# Patient Record
Sex: Male | Born: 2000 | Race: Black or African American | Hispanic: No | Marital: Single | Smoking: Never smoker
Health system: Southern US, Community
[De-identification: ages and names within clinical notes are randomized; demographics above are authoritative.]

## PROBLEM LIST (undated history)

## (undated) DIAGNOSIS — E119 Type 2 diabetes mellitus without complications: Secondary | ICD-10-CM

## (undated) DIAGNOSIS — Q249 Congenital malformation of heart, unspecified: Secondary | ICD-10-CM

## (undated) DIAGNOSIS — I10 Essential (primary) hypertension: Secondary | ICD-10-CM

## (undated) DIAGNOSIS — Q909 Down syndrome, unspecified: Secondary | ICD-10-CM

## (undated) HISTORY — PX: CARDIAC SURGERY: SHX584

## (undated) HISTORY — PX: CATARACT EXTRACTION, BILATERAL: SHX1313

## (undated) HISTORY — PX: TONSILLECTOMY: SUR1361

---
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
## 2019-01-22 DIAGNOSIS — Z794 Long term (current) use of insulin: Secondary | ICD-10-CM | POA: Insufficient documentation

## 2019-11-28 ENCOUNTER — Emergency Department (HOSPITAL_COMMUNITY)
Admission: EM | Admit: 2019-11-28 | Discharge: 2019-11-28 | Disposition: A | Discharge status: Home / Self Care | Payer: Medicaid Other | Attending: Emergency Medicine | Admitting: Emergency Medicine

## 2019-11-28 ENCOUNTER — Encounter (HOSPITAL_COMMUNITY): Payer: Self-pay

## 2019-11-28 ENCOUNTER — Emergency Department (HOSPITAL_COMMUNITY): Payer: Medicaid Other

## 2019-11-28 ENCOUNTER — Other Ambulatory Visit: Payer: Self-pay

## 2019-11-28 DIAGNOSIS — Z79899 Other long term (current) drug therapy: Secondary | ICD-10-CM | POA: Insufficient documentation

## 2019-11-28 DIAGNOSIS — Q909 Down syndrome, unspecified: Secondary | ICD-10-CM | POA: Diagnosis not present

## 2019-11-28 DIAGNOSIS — Z7982 Long term (current) use of aspirin: Secondary | ICD-10-CM | POA: Insufficient documentation

## 2019-11-28 DIAGNOSIS — Z794 Long term (current) use of insulin: Secondary | ICD-10-CM | POA: Diagnosis not present

## 2019-11-28 DIAGNOSIS — E119 Type 2 diabetes mellitus without complications: Secondary | ICD-10-CM | POA: Insufficient documentation

## 2019-11-28 DIAGNOSIS — R55 Syncope and collapse: Secondary | ICD-10-CM | POA: Diagnosis present

## 2019-11-28 DIAGNOSIS — Z20822 Contact with and (suspected) exposure to covid-19: Secondary | ICD-10-CM | POA: Insufficient documentation

## 2019-11-28 DIAGNOSIS — I10 Essential (primary) hypertension: Secondary | ICD-10-CM | POA: Diagnosis not present

## 2019-11-28 HISTORY — DX: Down syndrome, unspecified: Q90.9

## 2019-11-28 HISTORY — DX: Type 2 diabetes mellitus without complications: E11.9

## 2019-11-28 HISTORY — DX: Essential (primary) hypertension: I10

## 2019-11-28 HISTORY — DX: Congenital malformation of heart, unspecified: Q24.9

## 2019-11-28 LAB — CBC WITH DIFFERENTIAL/PLATELET
Abs Immature Granulocytes: 0.02 10*3/uL (ref 0.00–0.07)
Basophils Absolute: 0.1 10*3/uL (ref 0.0–0.1)
Basophils Relative: 1 %
Eosinophils Absolute: 0.1 10*3/uL (ref 0.0–0.5)
Eosinophils Relative: 1 %
HCT: 49.7 % (ref 39.0–52.0)
Hemoglobin: 17.2 g/dL — ABNORMAL HIGH (ref 13.0–17.0)
Immature Granulocytes: 0 %
Lymphocytes Relative: 32 %
Lymphs Abs: 2.4 10*3/uL (ref 0.7–4.0)
MCH: 29.8 pg (ref 26.0–34.0)
MCHC: 34.6 g/dL (ref 30.0–36.0)
MCV: 86 fL (ref 80.0–100.0)
Monocytes Absolute: 0.7 10*3/uL (ref 0.1–1.0)
Monocytes Relative: 9 %
Neutro Abs: 4.2 10*3/uL (ref 1.7–7.7)
Neutrophils Relative %: 57 %
Platelets: 225 10*3/uL (ref 150–400)
RBC: 5.78 MIL/uL (ref 4.22–5.81)
RDW: 13.6 % (ref 11.5–15.5)
WBC: 7.4 10*3/uL (ref 4.0–10.5)
nRBC: 0 % (ref 0.0–0.2)

## 2019-11-28 LAB — COMPREHENSIVE METABOLIC PANEL
ALT: 24 U/L (ref 0–44)
AST: 24 U/L (ref 15–41)
Albumin: 4.3 g/dL (ref 3.5–5.0)
Alkaline Phosphatase: 80 U/L (ref 38–126)
Anion gap: 5 (ref 5–15)
BUN: 14 mg/dL (ref 6–20)
CO2: 30 mmol/L (ref 22–32)
Calcium: 9.1 mg/dL (ref 8.9–10.3)
Chloride: 101 mmol/L (ref 98–111)
Creatinine, Ser: 1.12 mg/dL (ref 0.61–1.24)
GFR calc Af Amer: >60 mL/min (ref >60)
GFR calc non Af Amer: >60 mL/min (ref >60)
Glucose, Bld: 135 mg/dL — ABNORMAL HIGH (ref 70–99)
Potassium: 4.4 mmol/L (ref 3.5–5.1)
Sodium: 136 mmol/L (ref 135–145)
Total Bilirubin: 1 mg/dL (ref 0.3–1.2)
Total Protein: 7.5 g/dL (ref 6.5–8.1)

## 2019-11-28 LAB — URINALYSIS, ROUTINE W REFLEX MICROSCOPIC
Bilirubin Urine: NEGATIVE
Glucose, UA: NEGATIVE mg/dL
Hgb urine dipstick: NEGATIVE
Ketones, ur: NEGATIVE mg/dL
Leukocytes,Ua: NEGATIVE
Nitrite: NEGATIVE
Protein, ur: NEGATIVE mg/dL
Specific Gravity, Urine: 1.006 (ref 1.005–1.030)
pH: 7 (ref 5.0–8.0)

## 2019-11-28 LAB — TROPONIN I (HIGH SENSITIVITY)
Troponin I (High Sensitivity): 4 ng/L (ref <18)
Troponin I (High Sensitivity): 5 ng/L (ref <18)

## 2019-11-28 LAB — RESPIRATORY PANEL BY RT PCR (FLU A&B, COVID)
Influenza A by PCR: NEGATIVE
Influenza B by PCR: NEGATIVE
SARS Coronavirus 2 by RT PCR: NEGATIVE

## 2019-11-28 LAB — LIPASE, BLOOD: Lipase: 23 U/L (ref 11–51)

## 2019-11-28 LAB — CBG MONITORING, ED: Glucose-Capillary: 153 mg/dL — ABNORMAL HIGH (ref 70–99)

## 2019-11-28 NOTE — ED Notes (Signed)
Pt provided with coke and a cheese stick.

## 2019-11-28 NOTE — ED Triage Notes (Signed)
Pt BIB [facility] from home. Pts grandmother was assisting pt with washing hands after using the restroom, pt began leaning against the sink and was able to lower him to the floor. Per family pt never experienced LOC or hit head. Per family pt is now at his baseline. Pt has history of Down Syndrome, HTN, DM and congential heart defects.

## 2019-11-28 NOTE — ED Provider Notes (Signed)
[facility]-EMERGENCY DEPT Provider Note   CSN: [id number] Arrival date & time: 11/28/19  1522     History Chief Complaint  Patient presents with   Near Syncope    Ralf Konopka is a 19 y.o. 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  Patient presents today with his mother following a near syncopal episode while washing his hands today around 130-2 PM.  Per patient's mother who was in her room at the time, the patient was washing his hands with his grandmother when he slumped over the sink.  He was gently lowered to the floor by the grandmother and apparently did not completely lose consciousness.  No injuries from the event.  They report that patient had complained about chest pain shortly after the incident and had also complained of chest pain the day before as well.  He is unable to describe the nature of his chest pain.  Patient reports that he "passed out" while washing his hands.  When asked how he is feeling patient replies with "good".  Mother reports that patient's previous heart surgeries were performed at [facility] but they are currently in the process of establishing cardiology provider here in [place].  No history of recent illness, vomiting, diarrhea, cough, abnormal behavior or any additional concerns.  Level 5 caveat Down syndrome HPI     Past Medical History:  Diagnosis Date   Congenital heart defect    Diabetes mellitus without complication (HCC)    Down syndrome    Hypertension     There are no problems to display for this patient.   Past Surgical History:  Procedure Laterality Date   CARDIAC SURGERY     CATARACT EXTRACTION, BILATERAL     TONSILLECTOMY         History reviewed. No pertinent family history.  Social History   Tobacco Use   Smoking status: Never Smoker   Smokeless tobacco: Never Used   Substance Use Topics   Alcohol use: Never   Drug use: Never    Home Medications Prior to Admission medications   Medication Sig Start Date End Date Taking? Authorizing Provider  aspirin 81 MG EC tablet Take 1 tablet by mouth daily.   Yes [provider]  atorvastatin (LIPITOR) 40 MG tablet Take 40 mg by mouth daily. 10/12/19  Yes [provider]  cetirizine HCl (ZYRTEC) 5 MG/5ML SOLN Take 5 mLs by mouth daily.   Yes [provider]  Cholecalciferol 50 MCG (2000 UT) CAPS Take 2,000 Units by mouth daily. 04/17/19  Yes [provider]  fluticasone (FLONASE) 50 MCG/ACT nasal spray Place 1 spray into the nose at bedtime as needed for allergies. 07/02/19 07/01/20 Yes [provider]  insulin glargine (LANTUS) 100 UNIT/ML injection Inject 18 Units into the skin at bedtime.   Yes [provider]  lisinopril (ZESTRIL) 5 MG tablet Take 5 mg by mouth daily. 10/14/19  Yes [provider]  montelukast (SINGULAIR) 10 MG tablet Take 10 mg by mouth daily. 09/06/19  Yes [provider]  RESTASIS MULTIDOSE 0.05 % ophthalmic emulsion 1 drop 2 (two) times daily. 07/18/19   [provider]    Allergies    Other  Review of Systems   Review of Systems  Unable to perform ROS: Other     Physical Exam Updated Vital Signs BP 115/62    Pulse 65    Temp 97.8  F (36.6 C)    Resp 20    Ht 4' (1.219 m)    Wt 63 kg    SpO2 100%    BMI 42.42 kg/m   Physical Exam Constitutional:      General: He is not in acute distress.    Appearance: Normal appearance. He is well-developed. He is obese. He is not ill-appearing or diaphoretic.  HENT:     Head: Normocephalic and atraumatic.     Right Ear: External ear normal.     Left Ear: External ear normal.     Nose: Nose normal.  Eyes:     General: Vision grossly intact. Gaze aligned appropriately.     Pupils: Pupils are equal, round, and reactive to light.  Neck:     Trachea: Trachea and  phonation normal. No tracheal deviation.  Pulmonary:     Effort: Pulmonary effort is normal. No respiratory distress.  Abdominal:     General: There is no distension.     Palpations: Abdomen is soft.     Tenderness: There is no abdominal tenderness. There is no guarding or rebound.  Musculoskeletal:        General: Normal range of motion.     Cervical back: Normal range of motion.  Skin:    General: Skin is warm and dry.  Neurological:     Mental Status: He is alert.     GCS: GCS eye subscore is 4. GCS verbal subscore is 5. GCS motor subscore is 6.     Comments: Speech is clear and goal oriented, follows commands Major Cranial nerves without deficit, no facial droop Normal strength in upper and lower extremities bilaterally including dorsiflexion and plantar flexion, strong and equal grip strength Sensation normal to light and sharp touch Moves extremities without ataxia, coordination intact  Psychiatric:        Behavior: Behavior normal.    ED Results / Procedures / Treatments   Labs (all labs ordered are listed, but only abnormal results are displayed) Labs Reviewed  CBC WITH DIFFERENTIAL/PLATELET - Abnormal; Notable for the following components:      Result Value   Hemoglobin 17.2 (*)    All other components within normal limits  COMPREHENSIVE METABOLIC PANEL - Abnormal; Notable for the following components:   Glucose, Bld 135 (*)    All other components within normal limits  URINALYSIS, ROUTINE W REFLEX MICROSCOPIC - Abnormal; Notable for the following components:   Color, Urine STRAW (*)    All other components within normal limits  CBG MONITORING, ED - Abnormal; Notable for the following components:   Glucose-Capillary 153 (*)    All other components within normal limits  RESPIRATORY PANEL BY RT PCR (FLU A&B, COVID)  LIPASE, BLOOD  TROPONIN I (HIGH SENSITIVITY)  TROPONIN I (HIGH SENSITIVITY)    EKG EKG Interpretation  Date/Time:  Friday November 28 2019  15:32:54 EST Ventricular Rate:  71 PR Interval:    QRS Duration: 97 QT Interval:  382 QTC Calculation: 416 R Axis:   94 Text Interpretation: Sinus rhythm Borderline right axis deviation ST elev, probable normal early repol pattern agree, no old comparison Confirmed by Charlesetta Shanks [phone]) on 11/28/2019 4:52:05 PM   Radiology DG Chest 2 View  Result Date: 11/28/2019 CLINICAL DATA:  Chest pain, near syncope EXAM: CHEST - 2 VIEW COMPARISON:  None. FINDINGS: Cardiomegaly status post median sternotomy. Both lungs are clear. The visualized skeletal structures are unremarkable. IMPRESSION: Cardiomegaly without acute abnormality of the lungs. Electronically Signed  By: Lauralyn Primes M.D.   On: 11/28/2019 17:08    Procedures Procedures (including critical care time)  Medications Ordered in ED Medications - No data to display  ED Course  I have reviewed the triage vital signs and the nursing notes.  Pertinent labs & imaging results that were available during my care of the patient were reviewed by me and considered in my medical decision making (see chart for details).  Clinical Course as of Nov 27 2001  Fri Nov 28, 2019  [id number]   [BM]  1823 Dr. Johny Drilling   [BM]  1843 Dr. Johny Drilling; if it happens again call Dr. Noel Christmas office for a holter monitor.   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 Differential lung perfusion reveals 60% flow to the right pulmonary artery and 40% to the left. -Moderate right ventricular hypertrophy and dilation with thinning of the right ventricular outflow tract. There is moderately reduced RVEF likely secondary to reduced anterior wall motion. -Low  normal to mildly reduced left ventricular systolic function ---- MDM Rules/Calculators/A&P                     Patient arrives with his mother, well-appearing no acute distress, pleasant.  Reports that he is feeling "good".  Patient had experienced a near syncopal episode on washing his hands with his grandmother around 130-2 PM today and had apparently complained of some pain in the chest shortly afterwards.  Additionally there is history of patient complaining of chest pain the night before as well.  Patient otherwise in normal state of health per mother is at baseline currently.  Heart regular rate and rhythm, lungs clear bilaterally, abdomen soft nontender without peritoneal signs, neurovascular intact to all 4 extremities without evidence of DVT, cranial nerves intact, vital signs stable on room air.  Orthostatics obtained and are negative, blood pressures are softer with systolics in the 90s but patient is not symptomatic.  Will obtain basic blood work, troponin, EKG and chest x-ray then discussed with [facility] pediatric cardiology further recommendations.  Discussed case with Dr. Donnald Garre who agrees. - CBC with hemoglobin 17.2 otherwise within normal limits BMP with glucose 135 otherwise within normal limits Lipase within normal limits Urinalysis nonacute Covid/flu panel negative High-sensitivity troponin within normal limits x2 CBG 153  EKG: Sinus rhythm Borderline right axis deviation ST elev, probable normal early repol pattern agree, no old comparison Confirmed by Arby Barrette [phone]) on 11/28/2019 4:52:05 PM  CXR:  IMPRESSION:  Cardiomegaly without acute abnormality of the lungs.  - I discussed the case with [facility] on-call pediatric cardiology fellow Dr. Johny Drilling who discussed the case with her attending physician.  They advised that patient may be discharged at this time with follow-up with patient's cardiologist Dr. Mayer Camel next month as scheduled.  They advised that if patient has another  episode similar to today that his mother should call Dr. Noel Christmas office to schedule a sooner appointment and discuss the need for a Holter monitor.  No further recommendations. - Patient reassessed he is resting comfortably no acute distress. VSS throughout visit. Patient reports that he is feeling well at this time.  I had discussion with patient's mother and advised them on [facility] recommendations and  she states understanding.  She is agreeable to plan of care discharge at this time with outpatient follow-up with Dr. Noel Christmas office and to call his office if patient develops recurrent symptoms.  Additionally I discussed strict return precautions with mother who stated understanding.  Encouraged good p.o. intake and rest.  At this time there does not appear to be any evidence of an acute emergency medical condition and the patient appears stable for discharge with appropriate outpatient follow up. Diagnosis was discussed with mother who verbalizes understanding of care plan and is agreeable to discharge. I have discussed return precautions with mother who verbalizes understanding of return precautions. Mother encouraged to follow-up with their PCP and Cardiologist. All questions answered.  Patient's case discussed with Dr. Donnald Garre who agrees with plan to discharge with follow-up.   Note: Portions of this report may have been transcribed using voice recognition software. Every effort was made to ensure accuracy; however, inadvertent computerized transcription errors may still be present. Final Clinical Impression(s) / ED Diagnoses Final diagnoses:  Near syncope    Rx / DC Orders ED Discharge Orders    None       Elizabeth Palau 11/28/19 2004    Arby Barrette, MD 12/01/19 [phone]

## 2019-11-28 NOTE — Discharge Instructions (Addendum)
You have been diagnosed today with Near Syncope.  At this time there does not appear to be the presence of an emergent medical condition, however there is always the potential for conditions to change. Please read and follow the below instructions.  Please return to the Emergency Department immediately for any new or worsening symptoms or if your symptoms return. Please be sure to follow up with your Primary Care Provider within one week regarding your visit today; please call their office to schedule an appointment even if you are feeling better for a follow-up visit. Please be sure that your child drinks plenty of water, eats in the food and gets plenty of rest. Call Dr. Noel Christmas office tomorrow morning to inform them of your ER visit today and to schedule a follow-up appointment. If your child develops symptoms again return to the ER for evaluation immediately and then be sure to call Dr. Noel Christmas office immediately to move up your follow-up appointment.  Get help right away if your child: has a seizure. has pain in his: Chest. Belly (abdomen). Back. Faint once or more than once. Have a very bad headache. Are bleeding from your mouth or butt. Have black or tarry poop (stool). Have a very fast or uneven heartbeat (palpitations). Are mixed up (confused). Have trouble walking. Are very weak. Have trouble seeing. Has any new/concerning or worsening of symptoms  Please read the additional information packets attached to your discharge summary.  Do not take your medicine if  develop an itchy rash, swelling in your mouth or lips, or difficulty breathing; call 911 and seek immediate emergency medical attention if this occurs.  Note: Portions of this text may have been transcribed using voice recognition software. Every effort was made to ensure accuracy; however, inadvertent computerized transcription errors may still be present.

## 2020-07-19 DIAGNOSIS — R6889 Other general symptoms and signs: Secondary | ICD-10-CM | POA: Insufficient documentation

## 2020-07-19 DIAGNOSIS — R0989 Other specified symptoms and signs involving the circulatory and respiratory systems: Secondary | ICD-10-CM | POA: Insufficient documentation

## 2020-11-03 DIAGNOSIS — E782 Mixed hyperlipidemia: Secondary | ICD-10-CM | POA: Insufficient documentation

## 2020-11-03 DIAGNOSIS — I10 Essential (primary) hypertension: Secondary | ICD-10-CM | POA: Insufficient documentation

## 2021-02-14 ENCOUNTER — Ambulatory Visit: Payer: Medicaid Other | Admitting: Podiatry

## 2021-02-16 ENCOUNTER — Ambulatory Visit: Payer: Medicaid Other | Admitting: Podiatry

## 2021-02-21 ENCOUNTER — Other Ambulatory Visit: Payer: Self-pay

## 2021-02-21 ENCOUNTER — Ambulatory Visit (INDEPENDENT_AMBULATORY_CARE_PROVIDER_SITE_OTHER): Payer: Medicaid Other

## 2021-02-21 ENCOUNTER — Other Ambulatory Visit: Payer: Self-pay | Admitting: Podiatry

## 2021-02-21 ENCOUNTER — Ambulatory Visit (INDEPENDENT_AMBULATORY_CARE_PROVIDER_SITE_OTHER): Payer: Medicaid Other | Admitting: Podiatry

## 2021-02-21 DIAGNOSIS — G629 Polyneuropathy, unspecified: Secondary | ICD-10-CM

## 2021-02-21 DIAGNOSIS — L84 Corns and callosities: Secondary | ICD-10-CM

## 2021-02-21 DIAGNOSIS — L0889 Other specified local infections of the skin and subcutaneous tissue: Secondary | ICD-10-CM | POA: Diagnosis not present

## 2021-02-21 DIAGNOSIS — E0843 Diabetes mellitus due to underlying condition with diabetic autonomic (poly)neuropathy: Secondary | ICD-10-CM | POA: Diagnosis not present

## 2021-02-21 DIAGNOSIS — L98499 Non-pressure chronic ulcer of skin of other sites with unspecified severity: Secondary | ICD-10-CM

## 2021-02-21 DIAGNOSIS — L989 Disorder of the skin and subcutaneous tissue, unspecified: Secondary | ICD-10-CM | POA: Diagnosis not present

## 2021-02-21 NOTE — Progress Notes (Signed)
   Subjective: 20 y.o. male presenting to the office today as a new patient with his mother for evaluation of symptomatic skin lesions to the plantar aspect of the bilateral feet.  Patient states that they are symptomatic with walking.  Patient has Down syndrome but verbal.  Currently the mother tries to trim down the calluses on her own   Past Medical History:  Diagnosis Date  . Congenital heart defect   . Diabetes mellitus without complication (HCC)   . Down syndrome   . Hypertension      Objective:  Physical Exam General: Alert and oriented x3 in no acute distress  Dermatology: Hyperkeratotic lesion(s) present on the bilateral forefoot. Pain on palpation with a central nucleated core noted. Skin is warm, dry and supple bilateral lower extremities. Negative for open lesions or macerations.  Vascular: Palpable pedal pulses bilaterally. No edema or erythema noted. Capillary refill within normal limits.  Neurological: Epicritic and protective threshold grossly intact bilaterally.   Musculoskeletal Exam: Pain on palpation at the keratotic lesion(s) noted. Range of motion within normal limits bilateral. Muscle strength 5/5 in all groups bilateral.  Assessment: 1.  Preulcerative callus lesions bilateral forefoot   Plan of Care:  1. Patient evaluated 2. Excisional debridement of keratoic lesion(s) using a chisel blade was performed without incident.  3. Dressed area with light dressing. 4. Patient is to return to the clinic PRN.   Felecia Shelling, DPM [facility]  Dr. Felecia Shelling, DPM    [address] [place], [place] [address]                Office [phone]  Fax [phone]

## 2021-05-30 ENCOUNTER — Ambulatory Visit (INDEPENDENT_AMBULATORY_CARE_PROVIDER_SITE_OTHER): Payer: Medicaid Other | Admitting: Podiatry

## 2021-05-30 ENCOUNTER — Other Ambulatory Visit: Payer: Self-pay

## 2021-05-30 DIAGNOSIS — E0843 Diabetes mellitus due to underlying condition with diabetic autonomic (poly)neuropathy: Secondary | ICD-10-CM

## 2021-05-30 DIAGNOSIS — L989 Disorder of the skin and subcutaneous tissue, unspecified: Secondary | ICD-10-CM | POA: Diagnosis not present

## 2021-05-30 DIAGNOSIS — L0889 Other specified local infections of the skin and subcutaneous tissue: Secondary | ICD-10-CM

## 2021-05-30 DIAGNOSIS — L84 Corns and callosities: Secondary | ICD-10-CM | POA: Diagnosis not present

## 2021-05-31 NOTE — Progress Notes (Signed)
   Subjective: 20 y.o. male presenting to the office today for follow-up evaluation of symptomatic skin lesions to the plantar aspect of the bilateral feet.  Patient states that they are symptomatic with walking.  Patient has Down syndrome but verbal.  Currently the mother tries to trim down the calluses on her own   Past Medical History:  Diagnosis Date   Congenital heart defect    Diabetes mellitus without complication (HCC)    Down syndrome    Hypertension      Objective:  Physical Exam General: Alert and oriented x3 in no acute distress  Dermatology: Hyperkeratotic lesion(s) present on the bilateral forefoot. Pain on palpation with a central nucleated core noted. Skin is warm, dry and supple bilateral lower extremities. Negative for open lesions or macerations.  Vascular: Palpable pedal pulses bilaterally. No edema or erythema noted. Capillary refill within normal limits.  Neurological: Epicritic and protective threshold grossly intact bilaterally.   Musculoskeletal Exam: Pain on palpation at the keratotic lesion(s) noted. Range of motion within normal limits bilateral. Muscle strength 5/5 in all groups bilateral.  Assessment: 1.  Preulcerative callus lesions bilateral forefoot   Plan of Care:  1. Patient evaluated 2. Excisional debridement of keratoic lesion(s) using a chisel blade was performed without incident.  3. Dressed area with light dressing. 4. Patient is to return to the clinic PRN.   Felecia Shelling, DPM [facility]  Dr. Felecia Shelling, DPM    [address] [place], [place] [address]                Office [phone]  Fax [phone]

## 2021-06-24 IMAGING — CR DG CHEST 2V
2 series · 2 of 2 positions shown · non-contrast
Comparison: None.

CLINICAL DATA: Chest pain, near syncope

EXAM:
CHEST - 2 VIEW

[w chest lat]
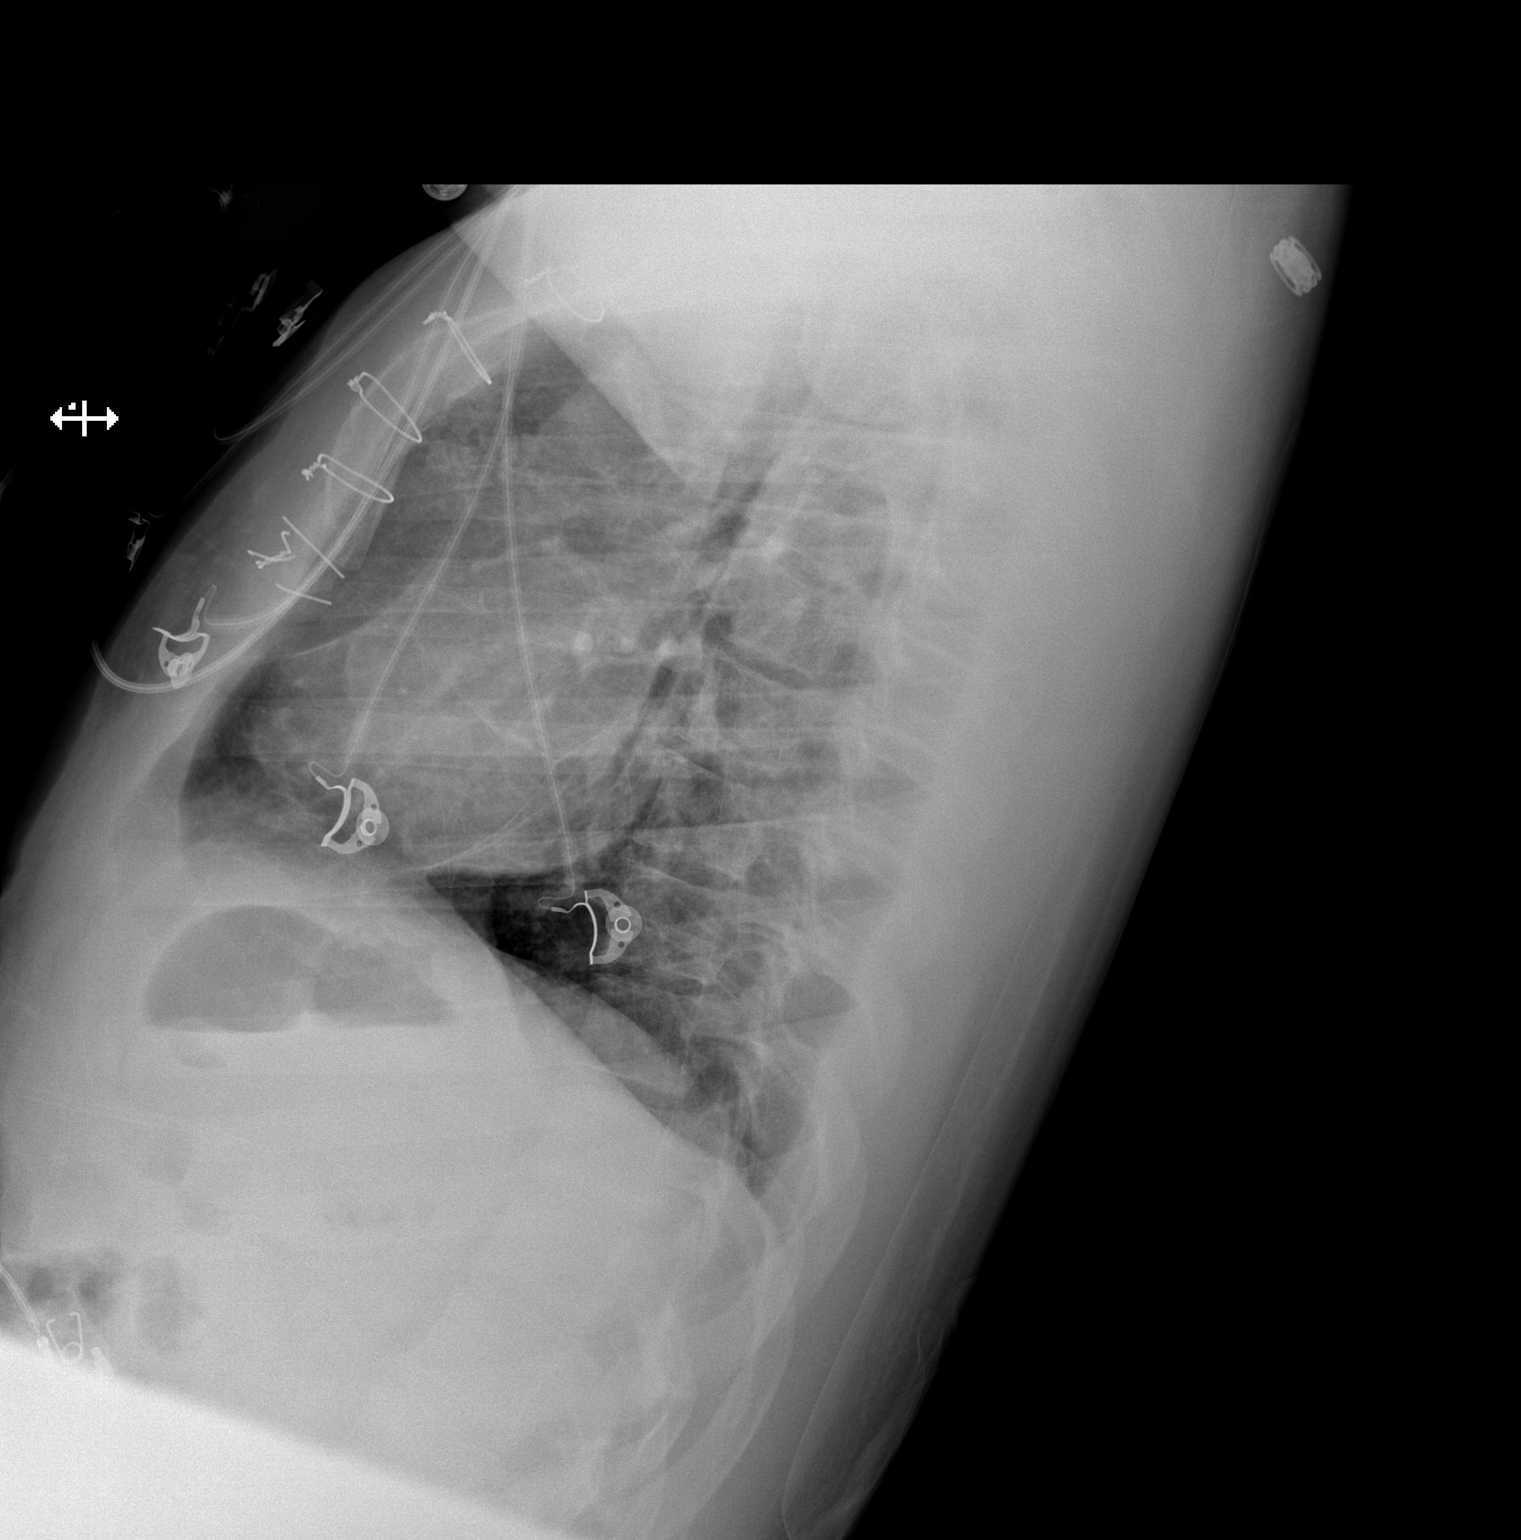

[x chest ap]
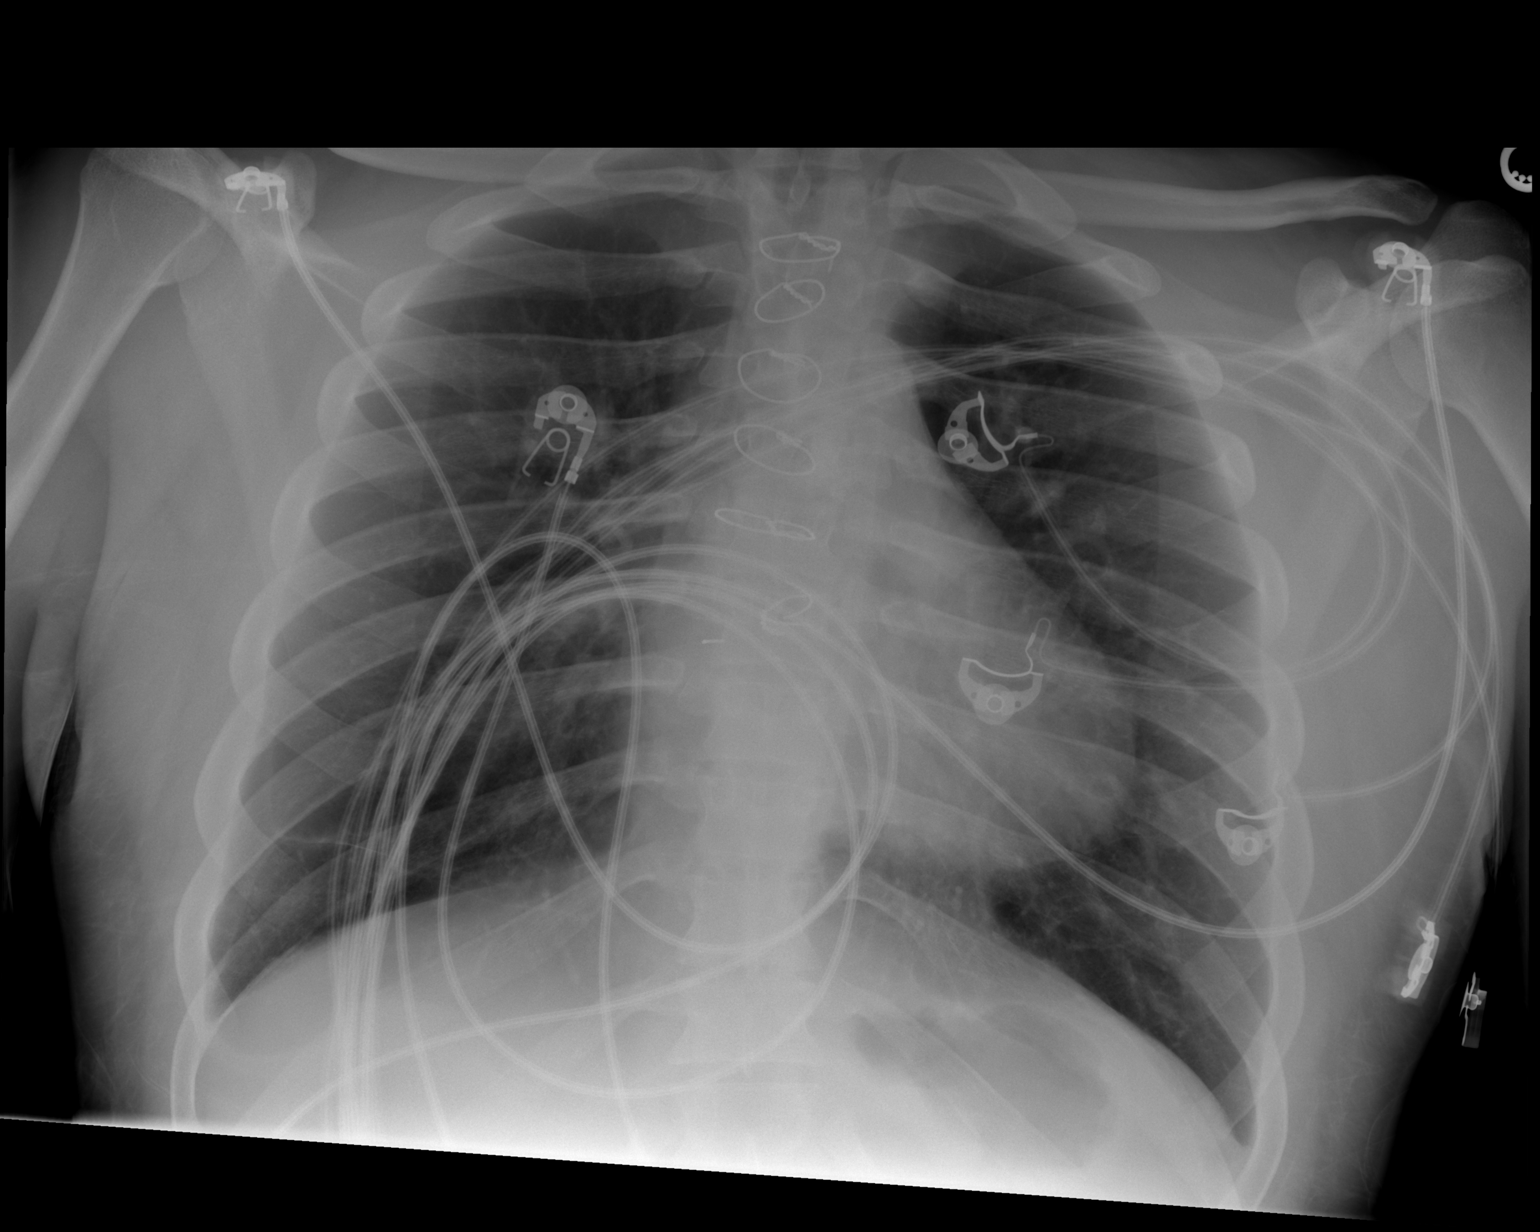

[2 of 2 positions shown; findings below may reference images not displayed]

FINDINGS: Cardiomegaly status post median sternotomy. Both lungs are clear.
The visualized skeletal structures are unremarkable.
IMPRESSION: Cardiomegaly without acute abnormality of the lungs.

## 2023-02-19 ENCOUNTER — Ambulatory Visit: Payer: Medicaid Other | Admitting: Orthopedic Surgery

## 2023-02-23 ENCOUNTER — Encounter: Payer: Self-pay | Admitting: Family

## 2023-02-23 ENCOUNTER — Ambulatory Visit (INDEPENDENT_AMBULATORY_CARE_PROVIDER_SITE_OTHER): Payer: Medicaid Other | Admitting: Family

## 2023-02-23 DIAGNOSIS — M2060 Acquired deformities of toe(s), unspecified, unspecified foot: Secondary | ICD-10-CM | POA: Diagnosis not present

## 2023-02-23 DIAGNOSIS — M21611 Bunion of right foot: Secondary | ICD-10-CM

## 2023-02-23 DIAGNOSIS — L97521 Non-pressure chronic ulcer of other part of left foot limited to breakdown of skin: Secondary | ICD-10-CM | POA: Diagnosis not present

## 2023-02-23 DIAGNOSIS — E119 Type 2 diabetes mellitus without complications: Secondary | ICD-10-CM | POA: Diagnosis not present

## 2023-02-23 DIAGNOSIS — M2141 Flat foot [pes planus] (acquired), right foot: Secondary | ICD-10-CM

## 2023-02-23 DIAGNOSIS — Z794 Long term (current) use of insulin: Secondary | ICD-10-CM

## 2023-02-23 DIAGNOSIS — L97511 Non-pressure chronic ulcer of other part of right foot limited to breakdown of skin: Secondary | ICD-10-CM | POA: Diagnosis not present

## 2023-02-23 DIAGNOSIS — M2142 Flat foot [pes planus] (acquired), left foot: Secondary | ICD-10-CM

## 2023-02-23 NOTE — Progress Notes (Signed)
Office Visit Note   Patient: Gerald Nguyen           Date of Birth: June 12, 2001           MRN: [id number] Visit Date: 02/23/2023              Requested by: Knox Royalty, MD [address] [place],  [place] [address] PCP: Knox Royalty, MD  Chief Complaint  Patient presents with   need for diabetic shoe evaluation      HPI: The patient is a 22 year old male with a history of trisomy 31 who comes in today for concern of chronic ulcerations to the forefeet bilaterally.  History provided by the mother.  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  His mom today is concerned for the chronic ulcers which continue to recur.  She has been having these routinely debrided with podiatry.  At this time the patient is not having any pain or difficulty bearing weight.  Assessment & Plan: Visit Diagnoses:  1. Toe deformity, unspecified laterality   2. Type 2 diabetes mellitus without complication, with long-term current use of insulin (HCC)   3. Ulcer of left foot, limited to breakdown of skin (HCC)   4. Right foot ulcer, limited to breakdown of skin (HCC)   5. Pes planus of both feet   6. Bunion of right foot     Plan: Given an order for extra-depth shoes with custom orthotics and a carbon fiber plate bilaterally.  Ulcers debrided x 5.  Patient tolerated well.  Weightbearing as tolerated.  Follow-Up Instructions: No follow-ups on file.   Ortho Exam  Patient is alert, oriented, no adenopathy, well-dressed, normal affect, normal respiratory effort. On examination bilateral feet he does have some crossover deformities pes planus present.  There is also valgus deformity bilateral great toes.  Beneath the second and fourth metatarsal heads bilaterally he has hyperkeratotic tissue buildup this was debrided with a 10 blade knife back to viable tissue.  On the right he also has Wagner grade 1 ulcer beneath the first metatarsal head which is again debrided back to  viable tissue.  There is no drainage no erythema no sign of infection  Imaging: No results found. No images are attached to the encounter.  Labs: No results found for: "HGBA1C", "ESRSEDRATE", "CRP", "LABURIC", "REPTSTATUS", "GRAMSTAIN", "CULT", "LABORGA"   Lab Results  Component Value Date   ALBUMIN 4.3 11/28/2019    No results found for: "MG" No results found for: "VD25OH"  No results found for: "PREALBUMIN"    Latest Ref Rng & Units 11/28/2019    4:54 PM  CBC EXTENDED  WBC 4.0 - 10.5 K/uL 7.4   RBC 4.22 - 5.81 MIL/uL 5.78   Hemoglobin 13.0 - 17.0 g/dL 19.1   HCT 47.8 - 29.5 % 49.7   Platelets 150 - 400 K/uL 225   NEUT# 1.7 - 7.7 K/uL 4.2   Lymph# 0.7 - 4.0 K/uL 2.4      There is no height or weight on file to calculate BMI.  Orders:  No orders of the defined types were placed in this encounter.  No orders of the defined types were placed in this encounter.    Procedures: No procedures performed  Clinical Data: No additional findings.  ROS:  All other systems negative, except as noted in the HPI. Review of Systems  Objective: Vital Signs: There were no vitals taken for this visit.  Specialty Comments:  No  specialty comments available.  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  Past Surgical History:  Procedure Laterality Date   CARDIAC SURGERY     CATARACT EXTRACTION,  BILATERAL     TONSILLECTOMY     Social History   Occupational History   Not on file  Tobacco Use   Smoking status: Never   Smokeless tobacco: Never  Substance and Sexual Activity   Alcohol use: Never   Drug use: Never   Sexual activity: Not on file

## 2023-03-08 ENCOUNTER — Telehealth: Payer: Self-pay | Admitting: Orthopedic Surgery

## 2023-03-08 NOTE — Telephone Encounter (Signed)
Pt's mother Antoinett called requesting script be faxed to [facility] for diabetic shoes. States [facility] never received it. Please call mom when fax at [phone].

## 2023-03-09 NOTE — Telephone Encounter (Signed)
Pt's mom informed that this form cannot be filled out by Dr. Lajoyce Corners, insurance will deny it. It has to be filled and out signed to whomever is managing his diabetes. She asked for me to fax to Dr. Eliane Decree office at [phone]

## 2023-11-29 ENCOUNTER — Encounter (HOSPITAL_COMMUNITY): Payer: Self-pay | Admitting: Emergency Medicine

## 2023-11-29 ENCOUNTER — Emergency Department (HOSPITAL_COMMUNITY)
Admission: EM | Admit: 2023-11-29 | Discharge: 2023-11-29 | Disposition: A | Discharge status: Home / Self Care | Payer: MEDICAID | Attending: Emergency Medicine | Admitting: Emergency Medicine

## 2023-11-29 DIAGNOSIS — R55 Syncope and collapse: Secondary | ICD-10-CM | POA: Diagnosis present

## 2023-11-29 DIAGNOSIS — U071 COVID-19: Secondary | ICD-10-CM | POA: Diagnosis not present

## 2023-11-29 LAB — BASIC METABOLIC PANEL
Anion gap: 10 (ref 5–15)
BUN: 15 mg/dL (ref 6–20)
CO2: 22 mmol/L (ref 22–32)
Calcium: 8 mg/dL — ABNORMAL LOW (ref 8.9–10.3)
Chloride: 109 mmol/L (ref 98–111)
Creatinine, Ser: 1.15 mg/dL (ref 0.61–1.24)
GFR, Estimated: >60 mL/min (ref >60)
Glucose, Bld: 71 mg/dL (ref 70–99)
Potassium: 3.6 mmol/L (ref 3.5–5.1)
Sodium: 141 mmol/L (ref 135–145)

## 2023-11-29 LAB — RESP PANEL BY RT-PCR (RSV, FLU A&B, COVID)  RVPGX2
Influenza A by PCR: NEGATIVE
Influenza B by PCR: NEGATIVE
Resp Syncytial Virus by PCR: NEGATIVE
SARS Coronavirus 2 by RT PCR: POSITIVE — AB

## 2023-11-29 LAB — CBC
HCT: 44.1 % (ref 39.0–52.0)
Hemoglobin: 15.1 g/dL (ref 13.0–17.0)
MCH: 29.9 pg (ref 26.0–34.0)
MCHC: 34.2 g/dL (ref 30.0–36.0)
MCV: 87.3 fL (ref 80.0–100.0)
Platelets: 146 10*3/uL — ABNORMAL LOW (ref 150–400)
RBC: 5.05 MIL/uL (ref 4.22–5.81)
RDW: 14.3 % (ref 11.5–15.5)
WBC: 5.1 10*3/uL (ref 4.0–10.5)
nRBC: 0 % (ref 0.0–0.2)

## 2023-11-29 LAB — CBG MONITORING, ED: Glucose-Capillary: 83 mg/dL (ref 70–99)

## 2023-11-29 MED ORDER — SODIUM CHLORIDE 0.9 % IV BOLUS
1000.0000 mL | Freq: Once | INTRAVENOUS | Status: AC
  Administered 2023-11-29: 1000 mL via INTRAVENOUS

## 2023-11-29 MED ORDER — ONDANSETRON HCL 4 MG/2ML IJ SOLN
4.0000 mg | Freq: Once | INTRAMUSCULAR | Status: DC
  Filled 2023-11-29: qty 2

## 2023-11-29 MED ORDER — LACTATED RINGERS IV BOLUS
1000.0000 mL | Freq: Once | INTRAVENOUS | Status: AC
  Administered 2023-11-29: 1000 mL via INTRAVENOUS

## 2023-11-29 NOTE — ED Triage Notes (Signed)
 Pt here from home with c/o syncopal episode while sitting on toilet, b/p up on arrival

## 2023-11-29 NOTE — ED Provider Triage Note (Signed)
 Emergency Medicine Provider Triage Evaluation Note  Gerald Nguyen , a 23 y.o. male  was evaluated in triage.  Pt complains of syncope. Pt was having a BM follows by a syncope PTA, it was witnessed by mother. No other sxs.  No injury.  No recent medication changes.  Has had a cough for 1 week, non productive.  No fever, chills, headache, cp, sob.  Review of Systems  Positive: As above Negative: As above  Physical Exam  BP (!) 85/67 (BP Location: Right Arm)   Pulse 77   Temp 97.8 F (36.6 C) (Oral)   Resp 18   SpO2 99%  Gen:   Awake, no distress   Resp:  Normal effort  MSK:   Moves extremities without difficulty  Other:    Medical Decision Making  Medically screening exam initiated at 1:05 PM.  Appropriate orders placed.  Gerald Nguyen was informed that the remainder of the evaluation will be completed by another provider, this initial triage assessment does not replace that evaluation, and the importance of remaining in the ED until their evaluation is complete.     Nivia Colon, PA-C 11/29/23 [phone]

## 2023-11-29 NOTE — ED Provider Notes (Signed)
 [place] [facility]MENT AT [facility] Provider Note   CSN: [id number] Arrival date & time: 11/29/23  1241     History Chief Complaint  Patient presents with   Loss of Consciousness    HPI Gerald Nguyen is a 23 y.o. male presenting for syncopal event. NAD Multiple years chills, nausea vomiting with fever shortness of breath prior to this.  Was on the toilet had a large bowel movement has been feeling vaguely weak throughout the past few days.  Some increased looseness in his stool today in particular. Patient's recorded medical, surgical, social, medication list and allergies were reviewed in the Snapshot window as part of the initial history.   Review of Systems   Review of Systems  Constitutional:  Negative for chills and fever.  HENT:  Negative for ear pain and sore throat.   Eyes:  Negative for pain and visual disturbance.  Respiratory:  Negative for cough and shortness of breath.   Cardiovascular:  Negative for chest pain and palpitations.  Gastrointestinal:  Negative for abdominal pain and vomiting.  Genitourinary:  Negative for dysuria and hematuria.  Musculoskeletal:  Negative for arthralgias and back pain.  Skin:  Negative for color change and rash.  Neurological:  Positive for syncope. Negative for seizures.  All other systems reviewed and are negative.   Physical Exam Updated Vital Signs BP 103/60   Pulse (!) 54   Temp 98 F (36.7 C)   Resp 18   SpO2 100%  Physical Exam Vitals and nursing note reviewed.  Constitutional:      General: He is not in acute distress.    Appearance: He is well-developed.  HENT:     Head: Normocephalic and atraumatic.  Eyes:     Conjunctiva/sclera: Conjunctivae normal.  Cardiovascular:     Rate and Rhythm: Normal rate and regular rhythm.     Heart sounds: No murmur heard. Pulmonary:     Effort: Pulmonary effort is normal. No respiratory distress.     Breath sounds: Normal breath sounds.   Abdominal:     Palpations: Abdomen is soft.     Tenderness: There is no abdominal tenderness.  Musculoskeletal:        General: No swelling.     Cervical back: Neck supple.  Skin:    General: Skin is warm and dry.     Capillary Refill: Capillary refill takes less than 2 seconds.  Neurological:     Mental Status: He is alert.  Psychiatric:        Mood and Affect: Mood normal.      ED Course/ Medical Decision Making/ A&P    Procedures Procedures   Medications Ordered in ED Medications  ondansetron  (ZOFRAN ) injection 4 mg (4 mg Intravenous Patient Refused/Not Given 11/29/23 1824)  sodium chloride  0.9 % bolus 1,000 mL (0 mLs Intravenous Stopped 11/29/23 1414)  lactated ringers  bolus 1,000 mL (1,000 mLs Intravenous New Bag/Given 11/29/23 1758)    Medical Decision Making:   Gerald Nguyen is a 23 y.o. male who presented to the ED today with a syncopal episode detailed above.    Additional history discussed with patient's family/caregivers.  Patient placed on continuous vitals and telemetry monitoring while in ED which was reviewed periodically.  Complete initial physical exam performed, notably the patient  was HDS in NAD.    Reviewed and confirmed nursing documentation for past medical history, family history, social history.    Initial Assessment:   With the patient's presentation of syncope,  most likely diagnosis is orthostatic hypotension vs vasovagal episode. Other diagnoses were considered including (but not limited to) arrythmogenic syncope, valvular abnormality, PE, aortic dissection. These are considered less likely due to history of present illness and physical exam findings.   This is most consistent with an acute life/limb threatening illness complicated by underlying chronic conditions. In particular, concerning cardiac etiology, this is less likely to be the etiology given the lack of chest pain, lack of serious comorbidities including heart failure or CAD.    Additionally, patient's history appears more consistent with benign episodes including orthostatic event vs  vagal episode based on prodrome.  Initial Plan:  Screening labs including CBC and Metabolic panel to evaluate for infectious or metabolic etiology of disease.  Urinalysis with reflex culture ordered to evaluate for UTI or relevant urologic/nephrologic pathology.  CXR to evaluate for structural/infectious intrathoracic pathology.  EKG to evaluate for cardiac pathology. Utilization of FAINT scoring detailed above.  Objective evaluation as below reviewed after administration of IVF/Telemetry monitoring  Initial Study Results:   Laboratory  All laboratory results reviewed without evidence of clinically relevant pathology.    EKG EKG was reviewed independently. Rate, rhythm, axis, intervals all examined and without medically relevant abnormality. ST segments without concerns for elevations.    Final Assessment and Plan:   Patient still present on his physical exam findings are not consistent with any acute pathology.  Considered cardiac etiology given his history of VSD, however he has been following very closely cardiology with reassuring echocardiograms recently per family. Treated with IV fluids in the emergency room blood pressure remained normal after resuscitation.  Patient's mother reports me in the hallway stating that she was requesting discharge as he has been doing well for the past few hours.  We discussed the risk of cardiac etiology given normal telemetry over 7 hours here and resolution of symptoms patient and family feel comfortable outpatient care management which seems reasonable at this time.  Disposition:  I have considered need for hospitalization, however, considering all of the above, I believe this patient is stable for discharge at this time.  Patient/family educated about specific return precautions for given chief complaint and symptoms.  Patient/family educated  about follow-up with PCP and cardiology.     Patient/family expressed understanding of return precautions and need for follow-up. Patient spoken to regarding all imaging and laboratory results and appropriate follow up for these results. All education provided in verbal form with additional information in written form. Time was allowed for answering of patient questions. Patient discharged.    Emergency Department Medication Summary:   Medications  ondansetron  (ZOFRAN ) injection 4 mg (4 mg Intravenous Patient Refused/Not Given 11/29/23 1824)  sodium chloride  0.9 % bolus 1,000 mL (0 mLs Intravenous Stopped 11/29/23 1414)  lactated ringers  bolus 1,000 mL (1,000 mLs Intravenous New Bag/Given 11/29/23 1758)    Clinical Impression:  1. Syncope and collapse      Discharge   Final Clinical Impression(s) / ED Diagnoses Final diagnoses:  Syncope and collapse    Rx / DC Orders ED Discharge Orders     None         Jerral Meth, MD 11/29/23 2006
